# Patient Record
Sex: Male | Born: 1970 | Race: Black or African American | Marital: Single | Smoking: Current every day smoker
Health system: Southern US, Community
[De-identification: ages and names within clinical notes are randomized; demographics above are authoritative.]

---

## 2014-09-26 ENCOUNTER — Encounter: Payer: Self-pay | Admitting: Emergency Medicine

## 2014-09-26 ENCOUNTER — Emergency Department
Admission: EM | Admit: 2014-09-26 | Discharge: 2014-09-26 | Disposition: A | Discharge status: Home / Self Care | Payer: Self-pay | Attending: Emergency Medicine | Admitting: Emergency Medicine

## 2014-09-26 DIAGNOSIS — IMO0002 Reserved for concepts with insufficient information to code with codable children: Secondary | ICD-10-CM

## 2014-09-26 DIAGNOSIS — Z23 Encounter for immunization: Secondary | ICD-10-CM | POA: Insufficient documentation

## 2014-09-26 DIAGNOSIS — S61411A Laceration without foreign body of right hand, initial encounter: Secondary | ICD-10-CM | POA: Insufficient documentation

## 2014-09-26 DIAGNOSIS — Y9389 Activity, other specified: Secondary | ICD-10-CM | POA: Insufficient documentation

## 2014-09-26 DIAGNOSIS — W25XXXA Contact with sharp glass, initial encounter: Secondary | ICD-10-CM | POA: Insufficient documentation

## 2014-09-26 DIAGNOSIS — Y998 Other external cause status: Secondary | ICD-10-CM | POA: Insufficient documentation

## 2014-09-26 DIAGNOSIS — Y9289 Other specified places as the place of occurrence of the external cause: Secondary | ICD-10-CM | POA: Insufficient documentation

## 2014-09-26 DIAGNOSIS — G5621 Lesion of ulnar nerve, right upper limb: Secondary | ICD-10-CM

## 2014-09-26 MED ORDER — TETANUS-DIPHTH-ACELL PERTUSSIS 5-2.5-18.5 LF-MCG/0.5 IM SUSP
0.5000 mL | Freq: Once | INTRAMUSCULAR | Status: AC
  Administered 2014-09-26: 0.5 mL via INTRAMUSCULAR
  Filled 2014-09-26: qty 0.5

## 2014-09-26 MED ORDER — LIDOCAINE HCL (PF) 1 % IJ SOLN
INTRAMUSCULAR | Status: AC
  Administered 2014-09-26: 5 mL via INTRADERMAL
  Filled 2014-09-26: qty 5

## 2014-09-26 MED ORDER — LIDOCAINE HCL (PF) 1 % IJ SOLN
5.0000 mL | Freq: Once | INTRAMUSCULAR | Status: AC
  Administered 2014-09-26: 5 mL via INTRADERMAL
  Filled 2014-09-26: qty 5

## 2014-09-26 NOTE — ED Provider Notes (Signed)
[facility] Emergency Department Provider Note  ____________________________________________  Time seen: Approximately 11:43 AM  I have reviewed the triage vital signs and the nursing notes.   HISTORY  Chief Complaint Extremity Laceration    HPI Rickey Long is a 44 y.o. male who presents to the emergency department for a right hand laceration. He was trying to get a window open that had been painted shut when his palm went through the glass.    History reviewed. No pertinent past medical history.  There are no active problems to display for this patient.   History reviewed. No pertinent past surgical history.  No current outpatient prescriptions on file.  Allergies Review of patient's allergies indicates no known allergies.  History reviewed. No pertinent family history.  Social History Social History  Substance Use Topics  . Smoking status: Never Smoker   . Smokeless tobacco: None  . Alcohol Use: No    Review of Systems   Constitutional: No fever/chills Eyes: No visual changes. ENT: No congestion or rhinorrhea Cardiovascular: Denies chest pain. Respiratory: Denies shortness of breath. Gastrointestinal: No abdominal pain.  No nausea, no vomiting.  No diarrhea.  No constipation. Genitourinary: Negative for dysuria. Musculoskeletal: Negative for back pain. Skin: Laceration to the right hand Neurological: Paresthesias to the ring and pinky finger tips of the right hand  10-point ROS otherwise negative.  ____________________________________________   PHYSICAL EXAM:  VITAL SIGNS: ED Triage Vitals  Enc Vitals Group     BP --      Pulse --      Resp --      Temp --      Temp src --      SpO2 --      Weight --      Height --      Head Cir --      Peak Flow --      Pain Score 09/26/14 1112 7     Pain Loc --      Pain Edu? --      Excl. in GC? --     Constitutional: Alert and oriented. Well appearing and in no acute  distress. Eyes: Conjunctivae are normal. PERRL. EOMI. Head: Atraumatic. Nose: No congestion/rhinnorhea. Mouth/Throat: Mucous membranes are moist.  Oropharynx non-erythematous. No oral lesions. Neck: No stridor. Cardiovascular: Normal rate, regular rhythm.  Good peripheral circulation. Respiratory: Normal respiratory effort.  No retractions. Lungs CTAB. Gastrointestinal: Soft and nontender. No distention. No abdominal bruits.  Musculoskeletal: No lower extremity tenderness nor edema.  No joint effusions. Neurologic:  Normal speech and language. No gross focal neurologic deficits are appreciated. Speech is normal. No gait instability. Some concern for ulnar nerve injury due to patient's description of paresthesia to the fifth and fourth digits. Patient has full motor function. Skin:  3.5 cm laceration to the palmar surface of the right hand with subcutaneous tissue exposure; Negative for petechiae.  Psychiatric: Mood and affect are normal. Speech and behavior are normal.  ____________________________________________   LABS (all labs ordered are listed, but only abnormal results are displayed)  Labs Reviewed - No data to display ____________________________________________  EKG   ____________________________________________  RADIOLOGY  Not indicated ____________________________________________   PROCEDURES  Procedure(s) performed:  LACERATION REPAIR Performed by: Kem Boroughs Authorized by: Kem Boroughs Consent: Verbal consent obtained. Risks and benefits: risks, benefits and alternatives were discussed Consent given by: patient Patient identity confirmed: provided demographic data Prepped and Draped in normal sterile fashion Wound explored  Laceration Location: Palmar  surface of right hand.  Laceration Length: 3.5cm  No Foreign Bodies seen or palpated  Anesthesia: local infiltration  Local anesthetic: lidocaine 1% without epinephrine  Anesthetic total: 10  ml  Irrigation method: syringe Amount of cleaning: standard  Skin closure: 4-0  Number of sutures: 6  Technique: Simple interrupted  Patient tolerance: Patient tolerated the procedure well with no immediate complications.  SPLINT APPLICATION Authorized by: Kem Boroughs Consent: Verbal consent obtained. Risks and benefits: risks, benefits and alternatives were discussed Consent given by: patient Splint applied by: Mellody Dance, ER Tech Location details: 4th and 5th digits of right hand Splint type: volar Supplies used: long finger aluminum foam  Post-procedure: The splinted body part was neurovascularly unchanged following the procedure. Patient tolerance: Patient tolerated the procedure well with no immediate complications.     ____________________________________________   INITIAL IMPRESSION / ASSESSMENT AND PLAN / ED COURSE  Pertinent labs & imaging results that were available during my care of the patient were reviewed by me and considered in my medical decision making (see chart for details).  Case discussed with Dr. Martha Clan who agrees that the likelyhood of ulnar laceration is unlikely. He will see him in outpatient follow up.  Patient was advised to call tomorrow for an appointment. He was advised to return to the emergency department for symptoms of concern. Sutures should be removed in 10 days. ____________________________________________   FINAL CLINICAL IMPRESSION(S) / ED DIAGNOSES  Final diagnoses:  Lesion of right ulnar nerve  Laceration       Chinita Pester, FNP 09/26/14 1551  Chinita Pester, FNP 09/26/14 1553  Minna Antis, MD 09/26/14 1555

## 2014-09-26 NOTE — Discharge Instructions (Signed)
Do not get the sutured area wet for 24 hours. After 24 hours, shower/bathe as usual and pat the area dry. °Change the bandage 2 times per day and apply antibiotic ointment. °Leave open to air when at no risk of getting the area dirty, but cover at night before bed. °Return in 10 days for suture removal or sooner for signs or concern of infection. ° ° °

## 2014-09-26 NOTE — ED Notes (Signed)
Patient to ED with laceration to right hand, reports attempting to open window and hand went through glass. Bleeding controlled at this time.

## 2014-12-31 ENCOUNTER — Telehealth: Payer: Self-pay | Admitting: Emergency Medicine

## 2014-12-31 ENCOUNTER — Encounter: Payer: Self-pay | Admitting: Emergency Medicine

## 2014-12-31 ENCOUNTER — Emergency Department
Admission: EM | Admit: 2014-12-31 | Discharge: 2014-12-31 | Discharge status: Against Medical Advice | Payer: Self-pay | Attending: Emergency Medicine | Admitting: Emergency Medicine

## 2014-12-31 ENCOUNTER — Emergency Department: Payer: Self-pay

## 2014-12-31 DIAGNOSIS — R0981 Nasal congestion: Secondary | ICD-10-CM | POA: Insufficient documentation

## 2014-12-31 DIAGNOSIS — R05 Cough: Secondary | ICD-10-CM | POA: Insufficient documentation

## 2014-12-31 DIAGNOSIS — F17210 Nicotine dependence, cigarettes, uncomplicated: Secondary | ICD-10-CM | POA: Insufficient documentation

## 2014-12-31 DIAGNOSIS — H61899 Other specified disorders of external ear, unspecified ear: Secondary | ICD-10-CM | POA: Insufficient documentation

## 2014-12-31 NOTE — ED Notes (Addendum)
Pt presents to ED with productive cough and congestion and ear fullness for the past 3 weeks. Pt reports that his symptoms are not improving so he wanted to be evaluated. Pt currently has no increase work of breathing or acute distress noted. Skin warm and dry.

## 2014-12-31 NOTE — ED Notes (Signed)
Called patient due to lwot to inquire about condition and follow up plans. Left message asking him to call me about his test results.  Pt has abnormal chest xray that needs follow up.  Need to ask patient if he has pcp.

## 2015-01-12 ENCOUNTER — Telehealth: Payer: Self-pay | Admitting: *Deleted

## 2015-01-12 NOTE — Telephone Encounter (Signed)
Left voicemail for patient to call back at the request of ED navigator. Will follow and attempt to assist patient to follow up on abnormality seen with recent chest xray.

## 2015-01-21 ENCOUNTER — Telehealth: Payer: Self-pay | Admitting: *Deleted

## 2015-01-21 NOTE — Telephone Encounter (Signed)
Attempted to contact patient again related to xray abnormality seen at ED visit. Voicemail left for patient. Contacted Rickey RumpfAdrena Long, friend listed in contacts. Ms. Rickey MustacheFisher will attempt to contact patient's son and have patient call me.

## 2015-01-25 ENCOUNTER — Telehealth: Payer: Self-pay | Admitting: *Deleted

## 2015-01-25 NOTE — Telephone Encounter (Signed)
Contacted patient at new number [phone] and after discussion with patient and physicians, made and notified patient of appointment for Thursday the 12th with 730am arrival.

## 2015-01-27 ENCOUNTER — Inpatient Hospital Stay: Payer: Self-pay | Attending: Cardiothoracic Surgery | Admitting: Cardiothoracic Surgery

## 2015-01-27 ENCOUNTER — Encounter: Payer: Self-pay | Admitting: *Deleted

## 2015-01-27 ENCOUNTER — Encounter: Payer: Self-pay | Admitting: Cardiothoracic Surgery

## 2015-01-27 VITALS — BP 148/84 | HR 82 | Temp 96.5°F | Ht 72.0 in | Wt 169.0 lb

## 2015-01-27 DIAGNOSIS — Z808 Family history of malignant neoplasm of other organs or systems: Secondary | ICD-10-CM | POA: Insufficient documentation

## 2015-01-27 DIAGNOSIS — R634 Abnormal weight loss: Secondary | ICD-10-CM | POA: Insufficient documentation

## 2015-01-27 DIAGNOSIS — M899 Disorder of bone, unspecified: Secondary | ICD-10-CM | POA: Insufficient documentation

## 2015-01-27 DIAGNOSIS — R918 Other nonspecific abnormal finding of lung field: Secondary | ICD-10-CM

## 2015-01-27 DIAGNOSIS — F17210 Nicotine dependence, cigarettes, uncomplicated: Secondary | ICD-10-CM | POA: Insufficient documentation

## 2015-01-27 DIAGNOSIS — R05 Cough: Secondary | ICD-10-CM | POA: Insufficient documentation

## 2015-01-27 DIAGNOSIS — H93292 Other abnormal auditory perceptions, left ear: Secondary | ICD-10-CM | POA: Insufficient documentation

## 2015-01-27 NOTE — Progress Notes (Signed)
  Oncology Nurse Navigator Documentation  Navigator Location: [facility] (01/27/15 1000) Navigator Encounter Type: Clinic/[facility] (01/27/15 1000)           Patient Visit Type: Surgery (01/27/15 1000) Treatment Phase: Abnormal Scans (01/27/15 1000) Barriers/Navigation Needs: Coordination of Care;Transportation;Financial (01/27/15 1000)   Interventions: Coordination of Care (01/27/15 1000)                      Time Spent with Patient: 45 (01/27/15 1000)   Met with patient at thoracic surgery consultation appointment. Introduced Programmer, multimedia and reviewed plan of care. Will follow.

## 2015-01-27 NOTE — Progress Notes (Signed)
Patient ID: Rickey Long, male   DOB: 07-15-1970, 45 y.o.   MRN: [id number]  Chief Complaint  Patient presents with  . Lung Mass    referral    Referred By emergency department Reason for Referral left rib mass  HPI Location, Quality, Duration, Severity, Timing, Context, Modifying Factors, Associated Signs and Symptoms.  Rickey Long is a 45 y.o. male.  He is originally from [place] and just moved here within the last few months and has not established care with any providers at. His problems began on the weekend of Thanksgiving 2016 when he takes very and stent the onset of chronic cough. He states that this occurred mostly when he was standing or sitting and was somewhat relieved with lying down. There is no associated signs or symptoms such as fever chills or sputum production. There is no hemoptysis. He has had about a 10 pound weight loss over the last 6 weeks and ultimately over the course of several weeks of his chronic cough he presented to the emergency room in mid December where chest x-ray was made. He does not have transportation and so he left the emergency room without getting the results. Ultimately he was found by phone and was instructed to follow-up for this rib mass. The patient states that he's had no prior x-rays. He's had no prior CT scans. He does not seek medical attention for any chronic issues. He takes no medications and has had no prior surgeries. He's had no history of malignancy himself. Today he states that his cough is now completely improved. He is back to his baseline. He has no medical problems as of now with the exception of occasional dullness in his left ear. He feels like he may have some fluid behind his left ear.   History reviewed. No pertinent past medical history.  History reviewed. No pertinent past surgical history.  Family History  Problem Relation Age of Onset  . Cancer Mother     throat cancer unknown age    Social History Social  History  Substance Use Topics  . Smoking status: Current Every Day Smoker -- 0.50 packs/day    Types: Cigarettes  . Smokeless tobacco: None  . Alcohol Use: No    No Known Allergies  No current outpatient prescriptions on file.   No current facility-administered medications for this visit.      Review of Systems A complete review of systems was asked and was negative except for the following positive findings 10 pound weight loss over the last several weeks. He also has some difficulty with hearing in his left ear.  Blood pressure 148/84, pulse 82, temperature 96.5 F (35.8 C), temperature source Tympanic, height 6' (1.829 m), weight 168 lb 15.7 oz (76.65 kg), SpO2 100 %.  Physical Exam CONSTITUTIONAL:  Pleasant, well-developed, well-nourished, and in no acute distress. EYES: Pupils equal and reactive to light, Sclera non-icteric EARS, NOSE, MOUTH AND THROAT:  The oropharynx was clear.  Dentition is good repair.  Oral mucosa pink and moist. LYMPH NODES:  Lymph nodes in the neck and axillae were normal RESPIRATORY:  Lungs were clear.  Normal respiratory effort without pathologic use of accessory muscles of respiration CARDIOVASCULAR: Heart was regular without murmurs.  There were no carotid bruits. GI: The abdomen was soft, nontender, and nondistended. There were no palpable masses. There was no hepatosplenomegaly. There were normal bowel sounds in all quadrants. GU:  Rectal deferred.   MUSCULOSKELETAL:  Normal muscle strength and tone.  No clubbing  or cyanosis.   SKIN:  There were no pathologic skin lesions.  There were no nodules on palpation. NEUROLOGIC:  Sensation is normal.  Cranial nerves are grossly intact. PSYCH:  Oriented to person, place and time.  Mood and affect are normal.  Data Reviewed Chest x-ray  I have personally reviewed the patient's imaging, laboratory findings and medical records.    Assessment    I have independently reviewed the patient's chest x-ray.  There is an expansile lesion on the left sixth rib. There is a defect in the left seventh rib. There are no lung masses. In the lungs themselves are clear.    Plan    I have recommended to him that we obtain a CT scan the chest for further characterization. He is agreeable to doing so. He'll come back to see us again next week.       Hulda Marinimothy Desmond Szabo, MD 01/27/2015, 9:05 AM

## 2015-02-02 ENCOUNTER — Ambulatory Visit: Admission: RE | Admit: 2015-02-02 | Payer: Self-pay | Source: Ambulatory Visit

## 2015-02-03 ENCOUNTER — Inpatient Hospital Stay: Payer: Self-pay | Admitting: Cardiothoracic Surgery

## 2017-04-09 IMAGING — CR DG CHEST 2V
2 series · 2 of 2 positions shown · non-contrast
Comparison: None.

CLINICAL DATA: Subacute onset of productive cough and congestion.
Ear fullness. Initial encounter.

EXAM:
CHEST  2 VIEW

[chest pa]
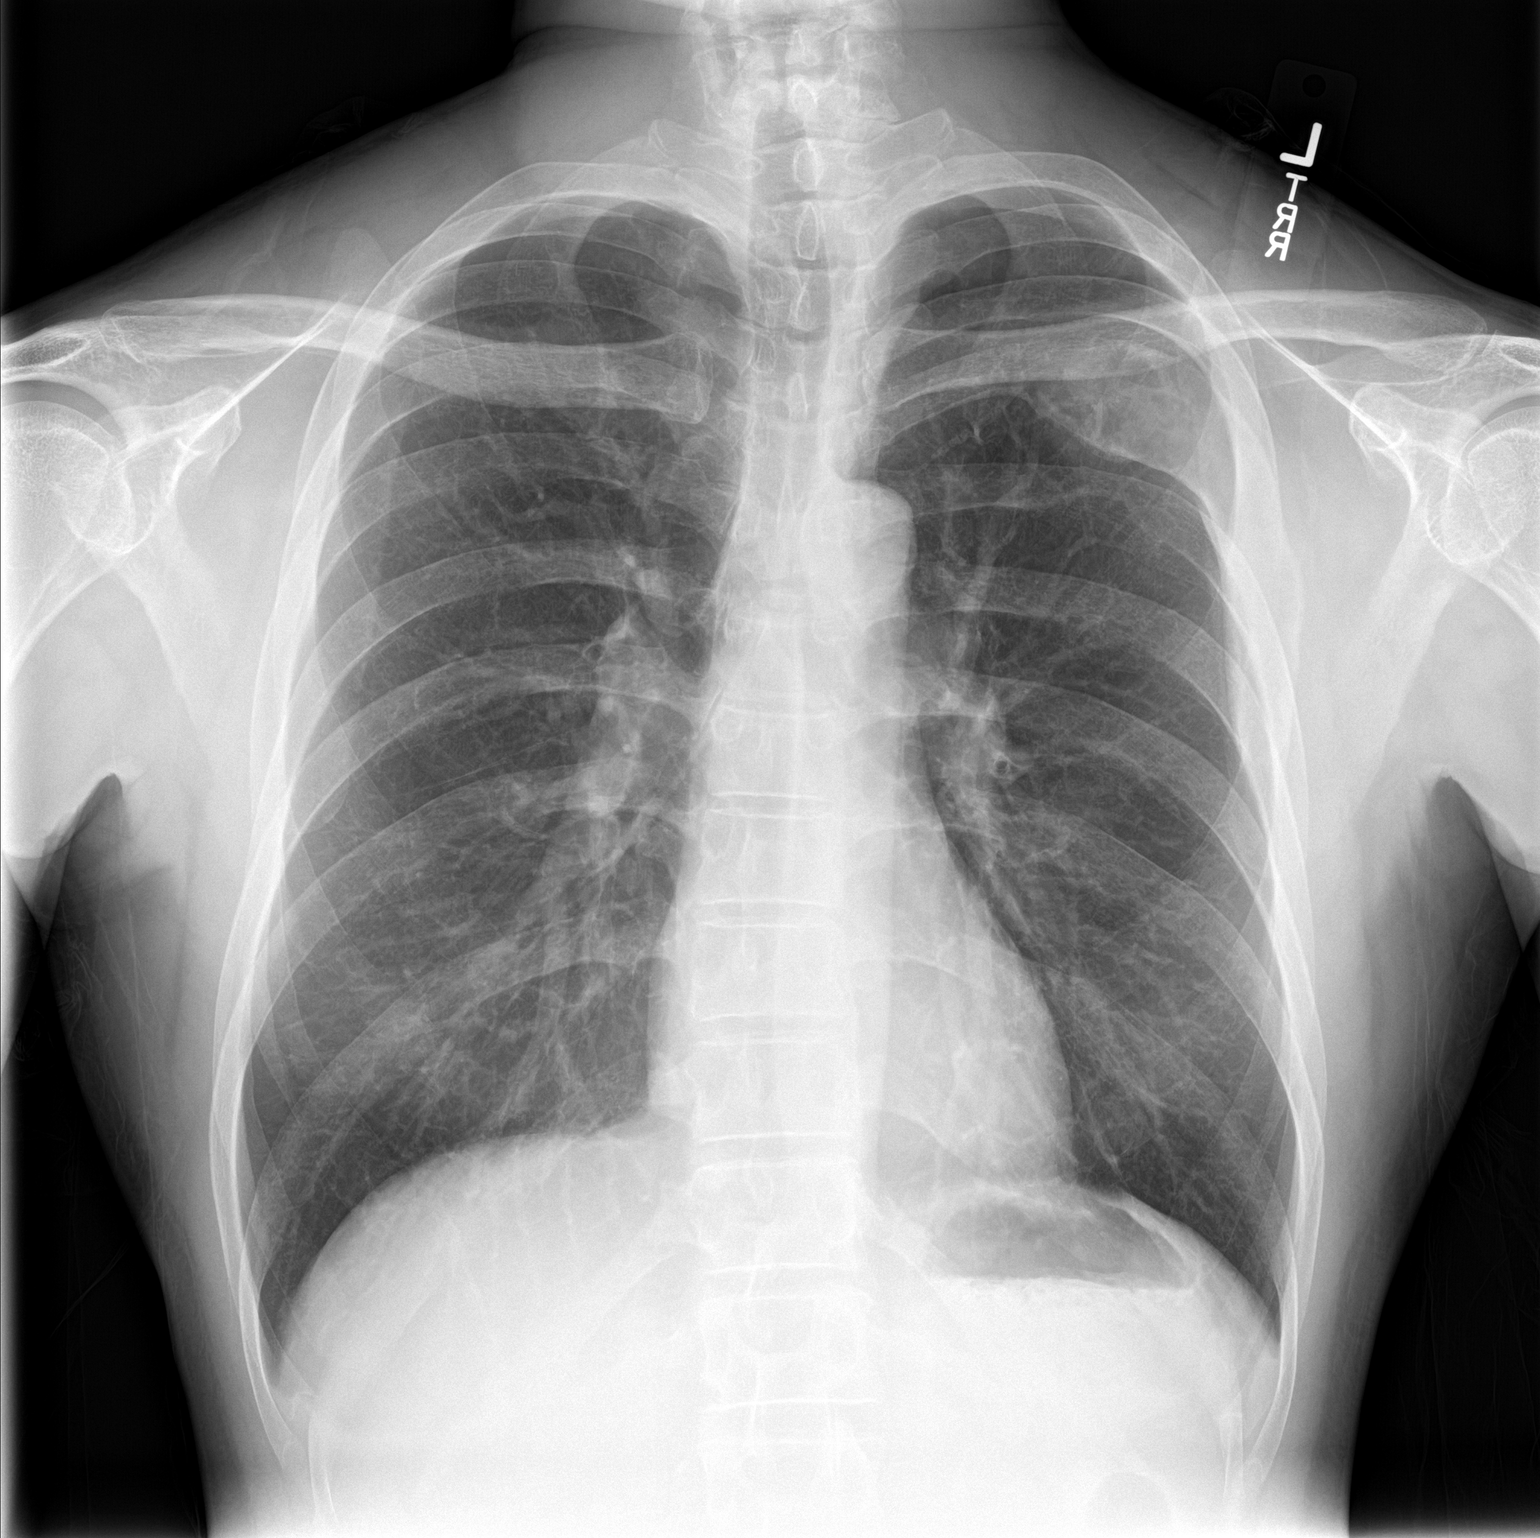

[chest lat]
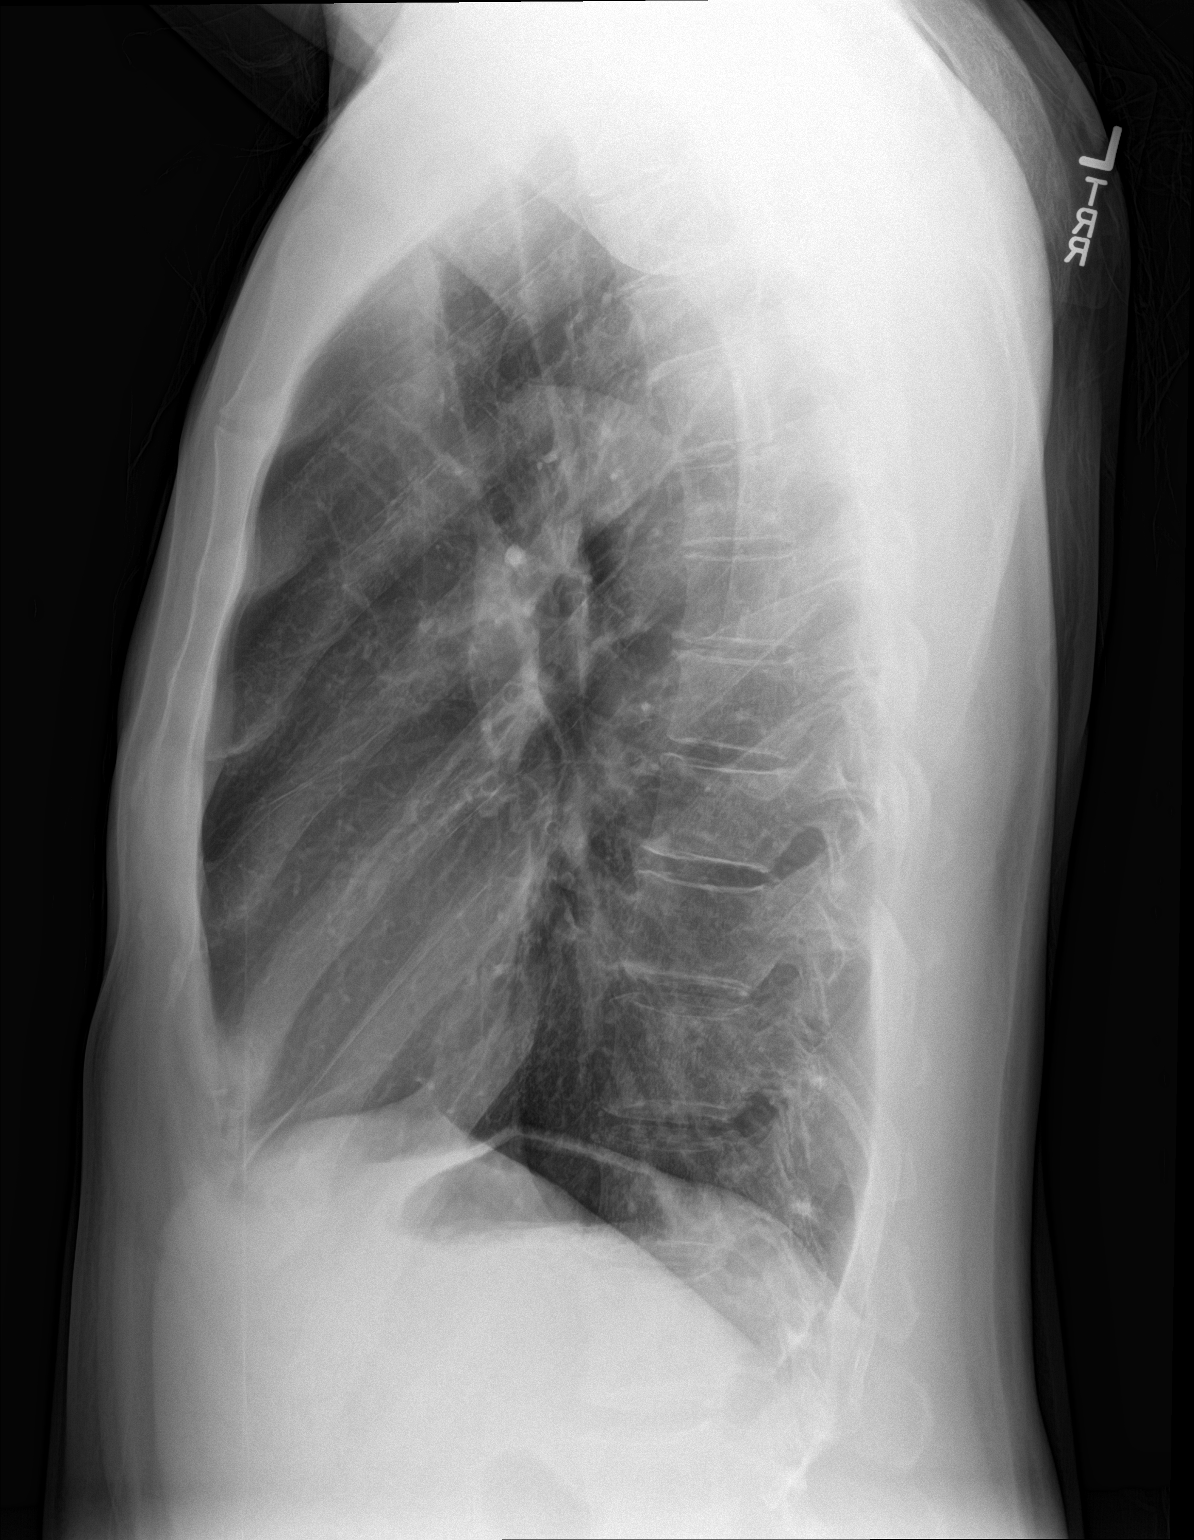

[2 of 2 positions shown; findings below may reference images not displayed]

FINDINGS: The lungs are well-aerated. Mild peribronchial thickening is noted.
There is no evidence of focal opacification, pleural effusion or
pneumothorax.

The heart is normal in size; the mediastinal contour is within
normal limits. There is an unusual expansile lesion at the left
fifth posterolateral rib, and apparent partial defect at the left
sixth posterior rib.
IMPRESSION: 1. Unusual expansile lesion at the left fifth posterolateral rib,
and apparent partial defect at the left sixth posterior rib. This
may be further characterized on MRI. Alternatively, consultation
could be considered for CT-guided biopsy.
2. Mild peribronchial thickening noted.  Lungs otherwise clear.
